# Patient Record
Sex: Female | Born: 2000 | Race: Black or African American | Hispanic: No | Marital: Single | Smoking: Never smoker
Health system: Southern US, Community
[De-identification: ages and names within clinical notes are randomized; demographics above are authoritative.]

## PROBLEM LIST (undated history)

## (undated) DIAGNOSIS — R443 Hallucinations, unspecified: Secondary | ICD-10-CM

## (undated) DIAGNOSIS — G4710 Hypersomnia, unspecified: Secondary | ICD-10-CM

## (undated) DIAGNOSIS — F840 Autistic disorder: Secondary | ICD-10-CM

## (undated) DIAGNOSIS — J45909 Unspecified asthma, uncomplicated: Secondary | ICD-10-CM

## (undated) DIAGNOSIS — R569 Unspecified convulsions: Secondary | ICD-10-CM

---
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
## 2021-03-02 IMAGING — CT CT HEAD W/O CM
4 series · 17 of 47 positions shown, 19 images · non-contrast
Comparison: Head CT scan 04/20/2018.

CLINICAL DATA: Increasing C scratch the history of seizures.
Seizures of increased over the past week. [id number] positive patient.

EXAM:
CT HEAD WITHOUT CONTRAST
TECHNIQUE: Contiguous axial images were obtained from the base of the skull
through the vertex without intravenous contrast.

[Series 2: head wo · axial · 0.40mm/px · z∈[-136,-16]mm · 7 of 33 slices shown, 9 images]
[im 5/33  brain]
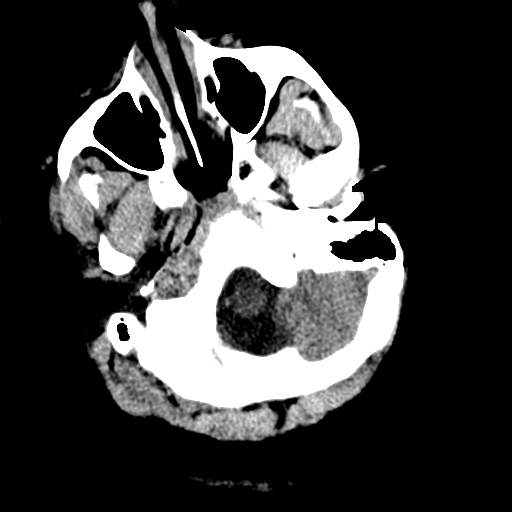
[im 5/33  bone]
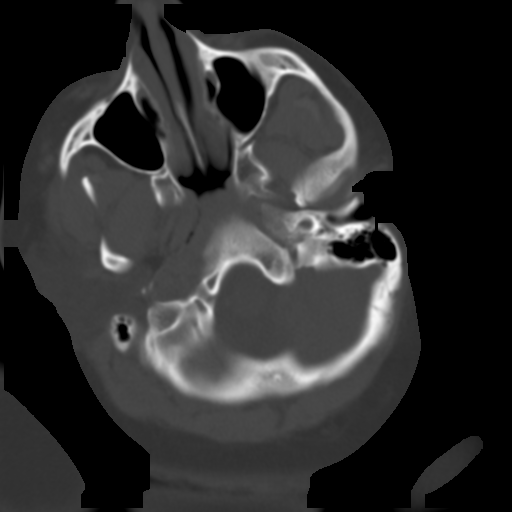
[im 9/33  brain]
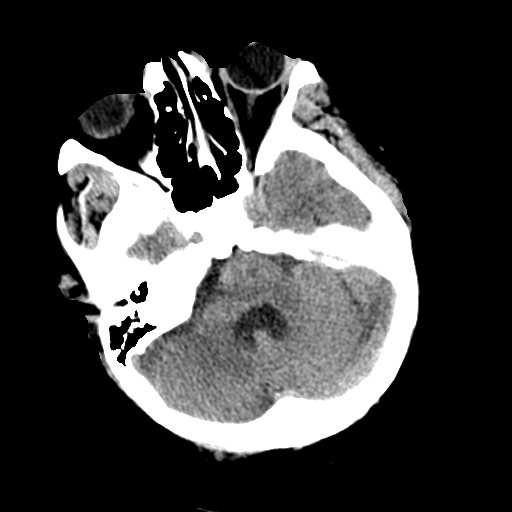
[im 13/33  brain]
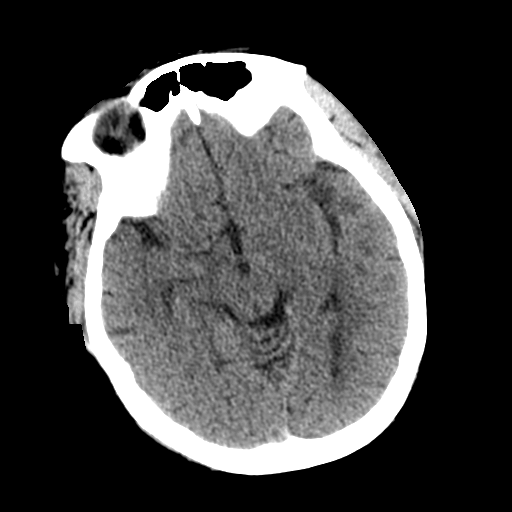
[im 17/33  brain]
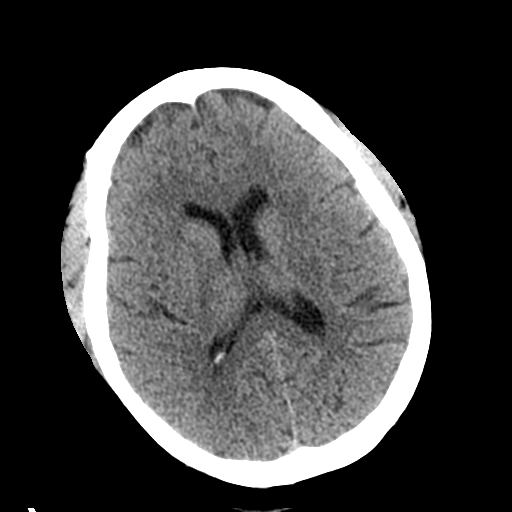
[im 21/33  brain]
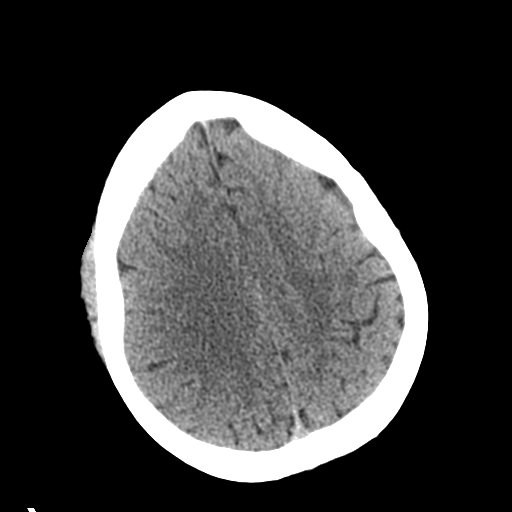
[im 21/33  bone]
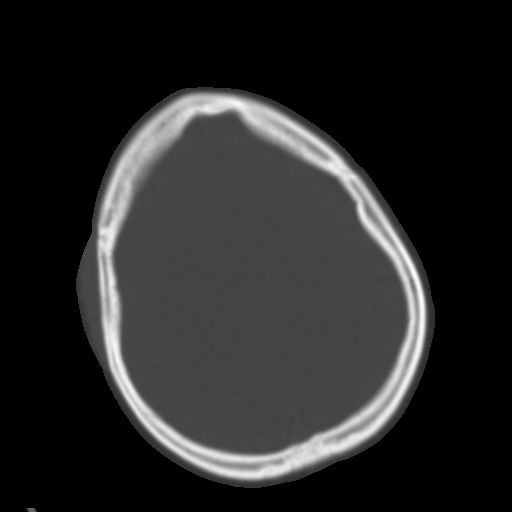
[im 25/33  brain]
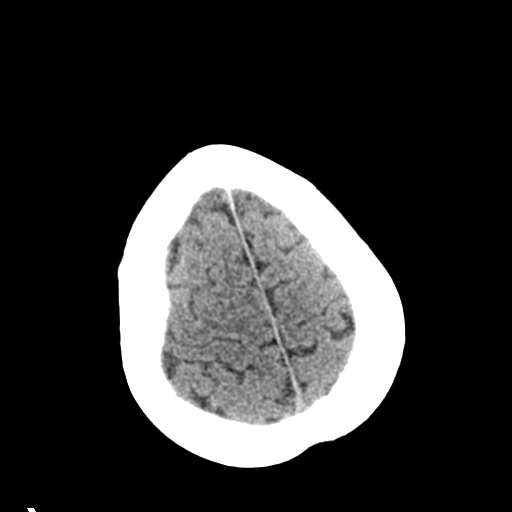
[im 29/33  brain]
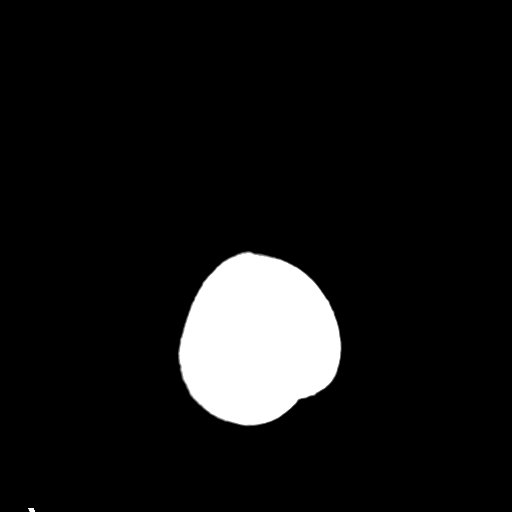

[Series 3: head bone · axial · 0.40mm/px · z∈[-140,-84]mm · 4 of 83 slices shown]
[im 9/83  bone]
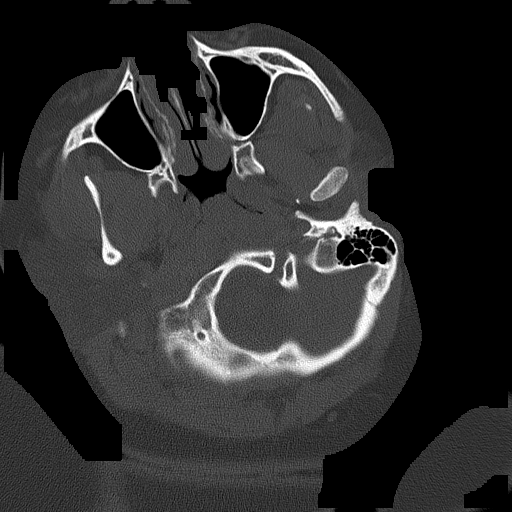
[im 17/83  bone]
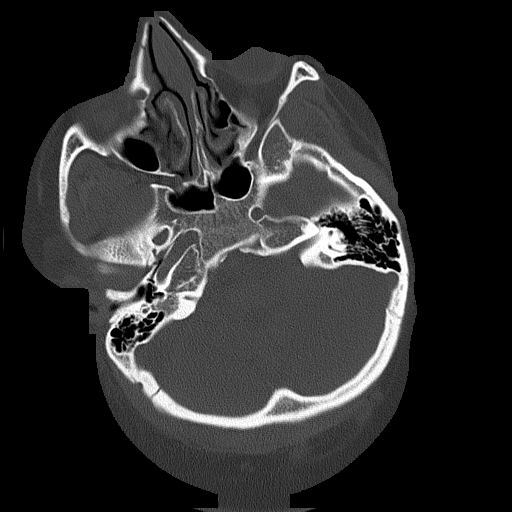
[im 25/83  bone]
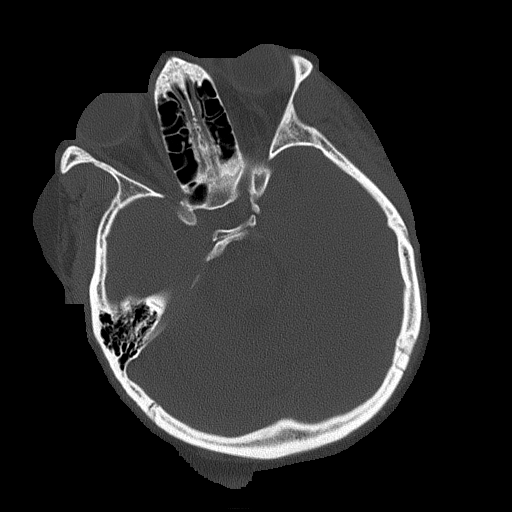
[im 37/83  bone]
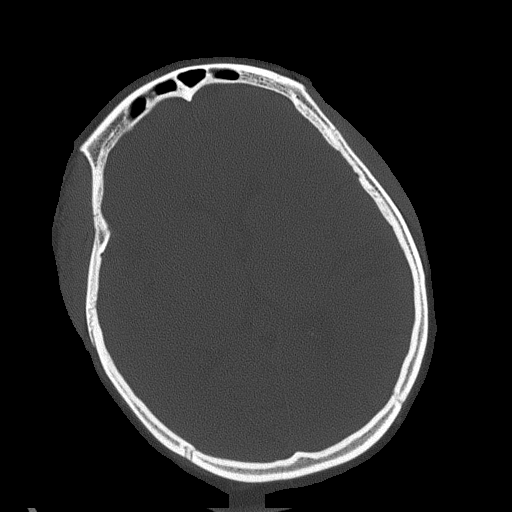

[Series 5: coronal soft tissue · coronal · 0.31mm/px · 3 of 59 slices shown]
[im 21/59  brain]
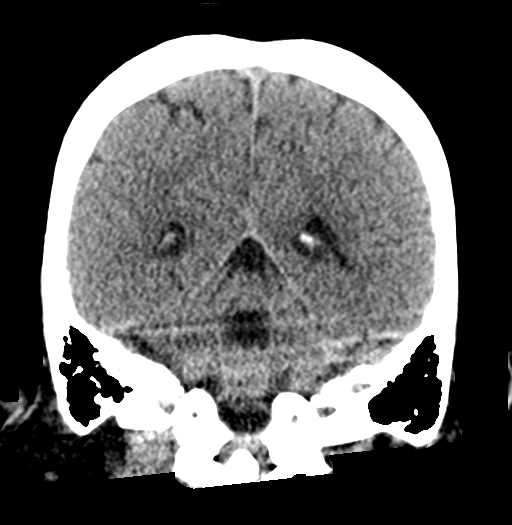
[im 27/59  brain]
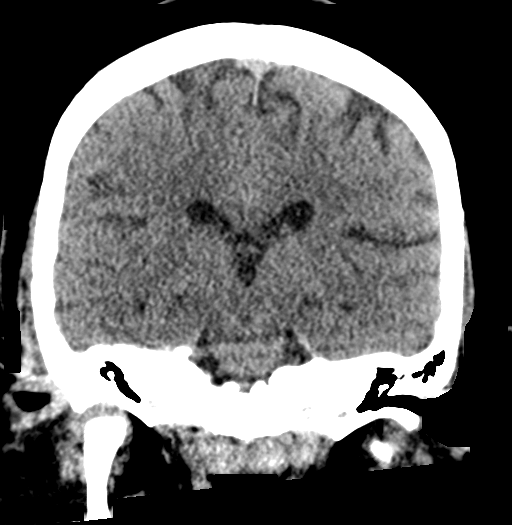
[im 32/59  brain]
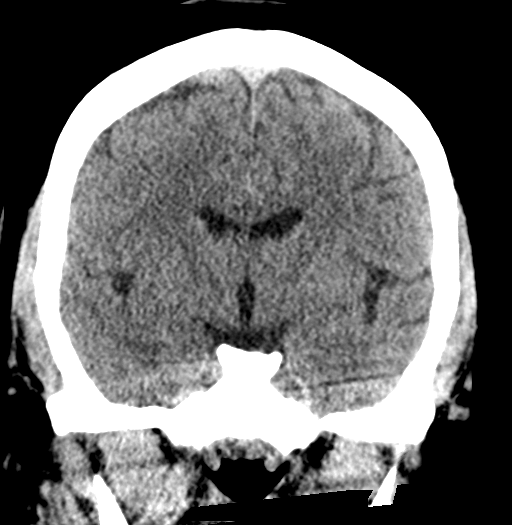

[Series 6: sagittal soft tissue · sagittal · 0.32mm/px · 3 of 54 slices shown]
[im 18/54  brain]
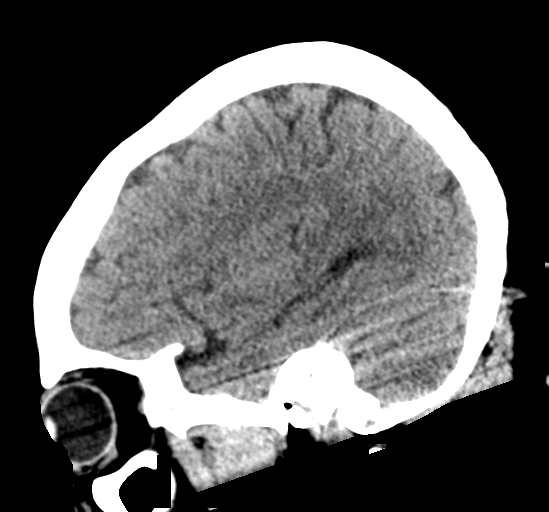
[im 27/54  brain]
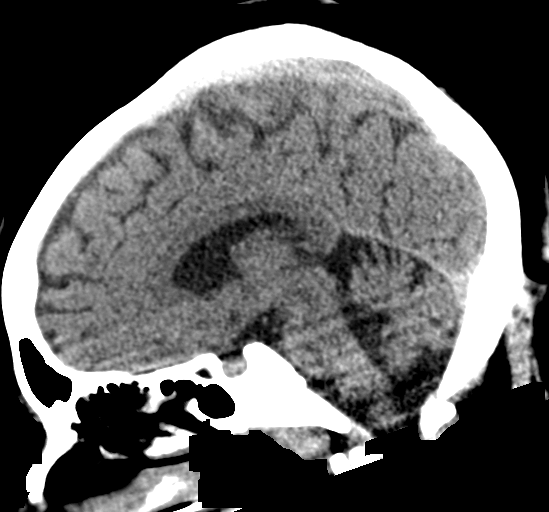
[im 36/54  brain]
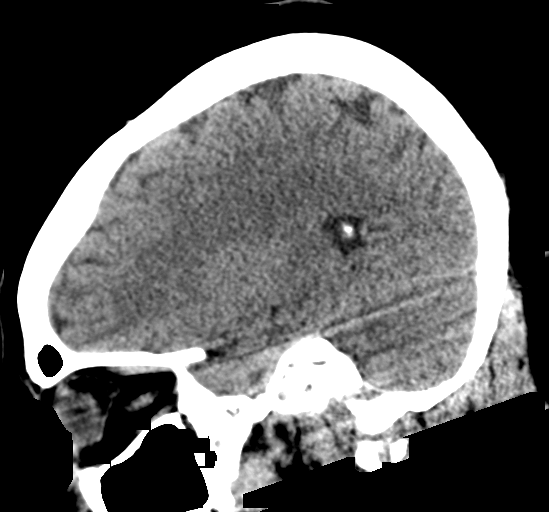

[17 of 47 positions shown; findings below may reference images not displayed]

FINDINGS: Brain: No evidence of acute infarction, hemorrhage, hydrocephalus,
extra-axial collection or mass lesion/mass effect.

Vascular: No hyperdense vessel or unexpected calcification.

Skull: Normal. Negative for fracture or focal lesion.

Sinuses/Orbits: Negative.

Other: None.
IMPRESSION: Negative head CT.

## 2021-03-02 IMAGING — DX DG CHEST 1V PORT
1 series · 1 of 1 positions shown · non-contrast
Comparison: Single-view of the chest 12/05/2018.

CLINICAL DATA: Increased frequency of seizures. Patient diagnosed
with [id number] approximately 1 week ago.

EXAM:
PORTABLE CHEST 1 VIEW

[chest ap]
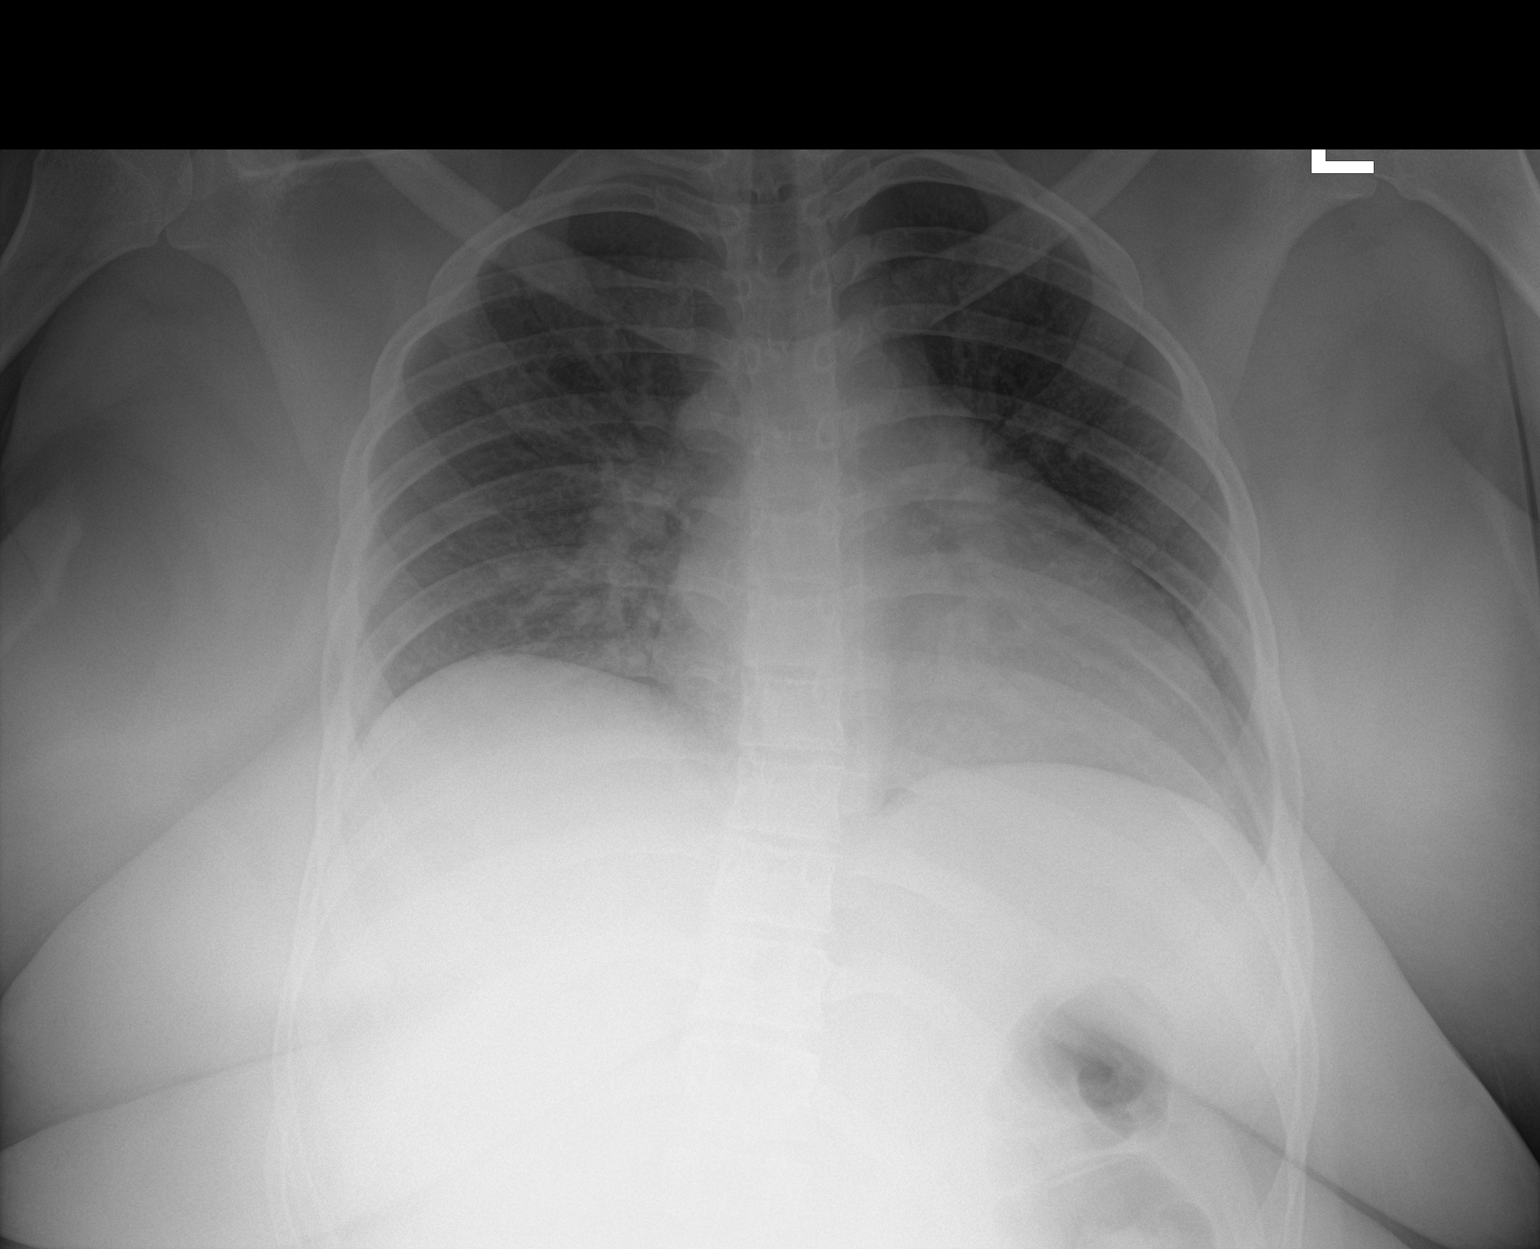

[1 of 1 positions shown; findings below may reference images not displayed]

FINDINGS: Lungs are clear. Heart size is normal. No pneumothorax or pleural
fluid. No acute or focal bony abnormality.
IMPRESSION: Negative chest.

## 2021-03-02 IMAGING — CR DG TIBIA/FIBULA 2V*L*
2 series · 2 of 2 positions shown · non-contrast
Comparison: None.

CLINICAL DATA: Fall, pain

EXAM:
LEFT TIBIA AND FIBULA - 2 VIEW

[x tib-fib lat left (1 of 2)]
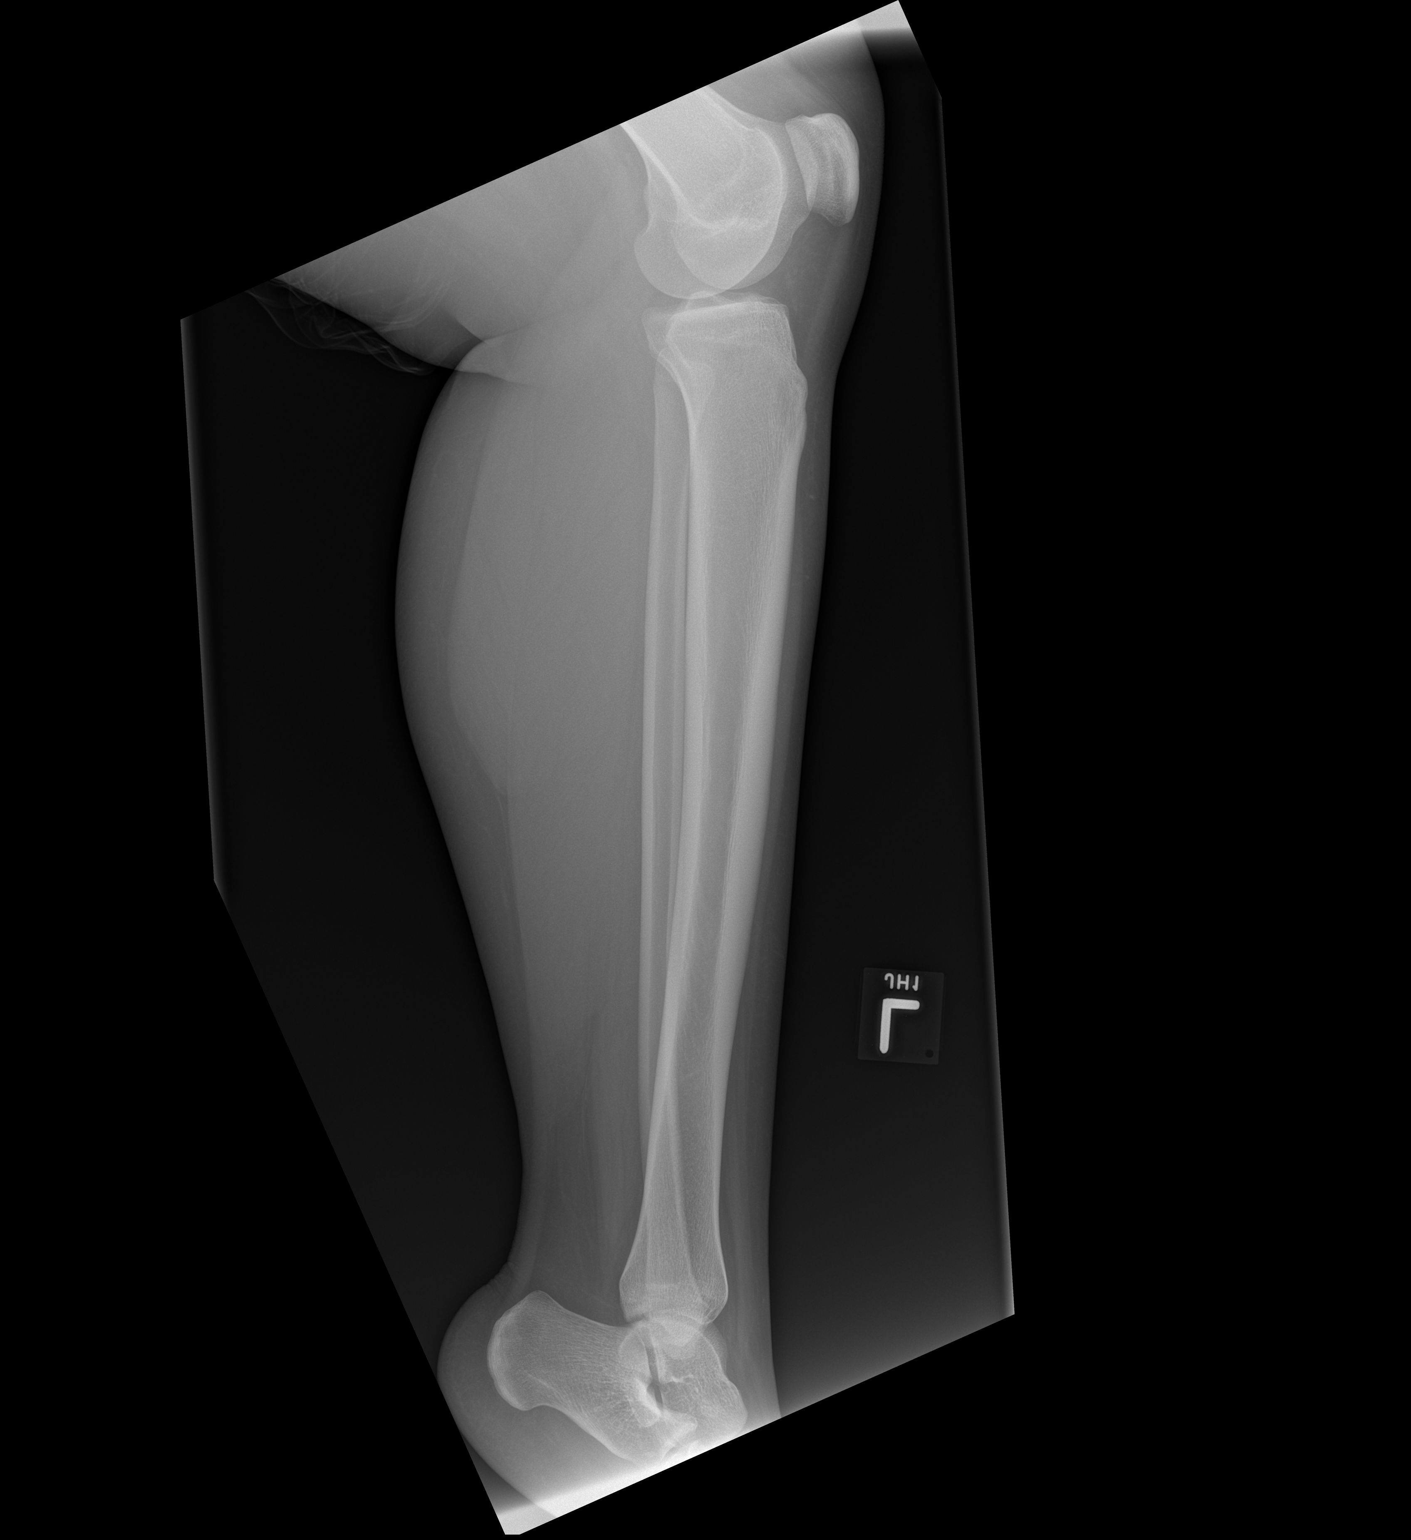

[x tib-fib lat left (2 of 2)]
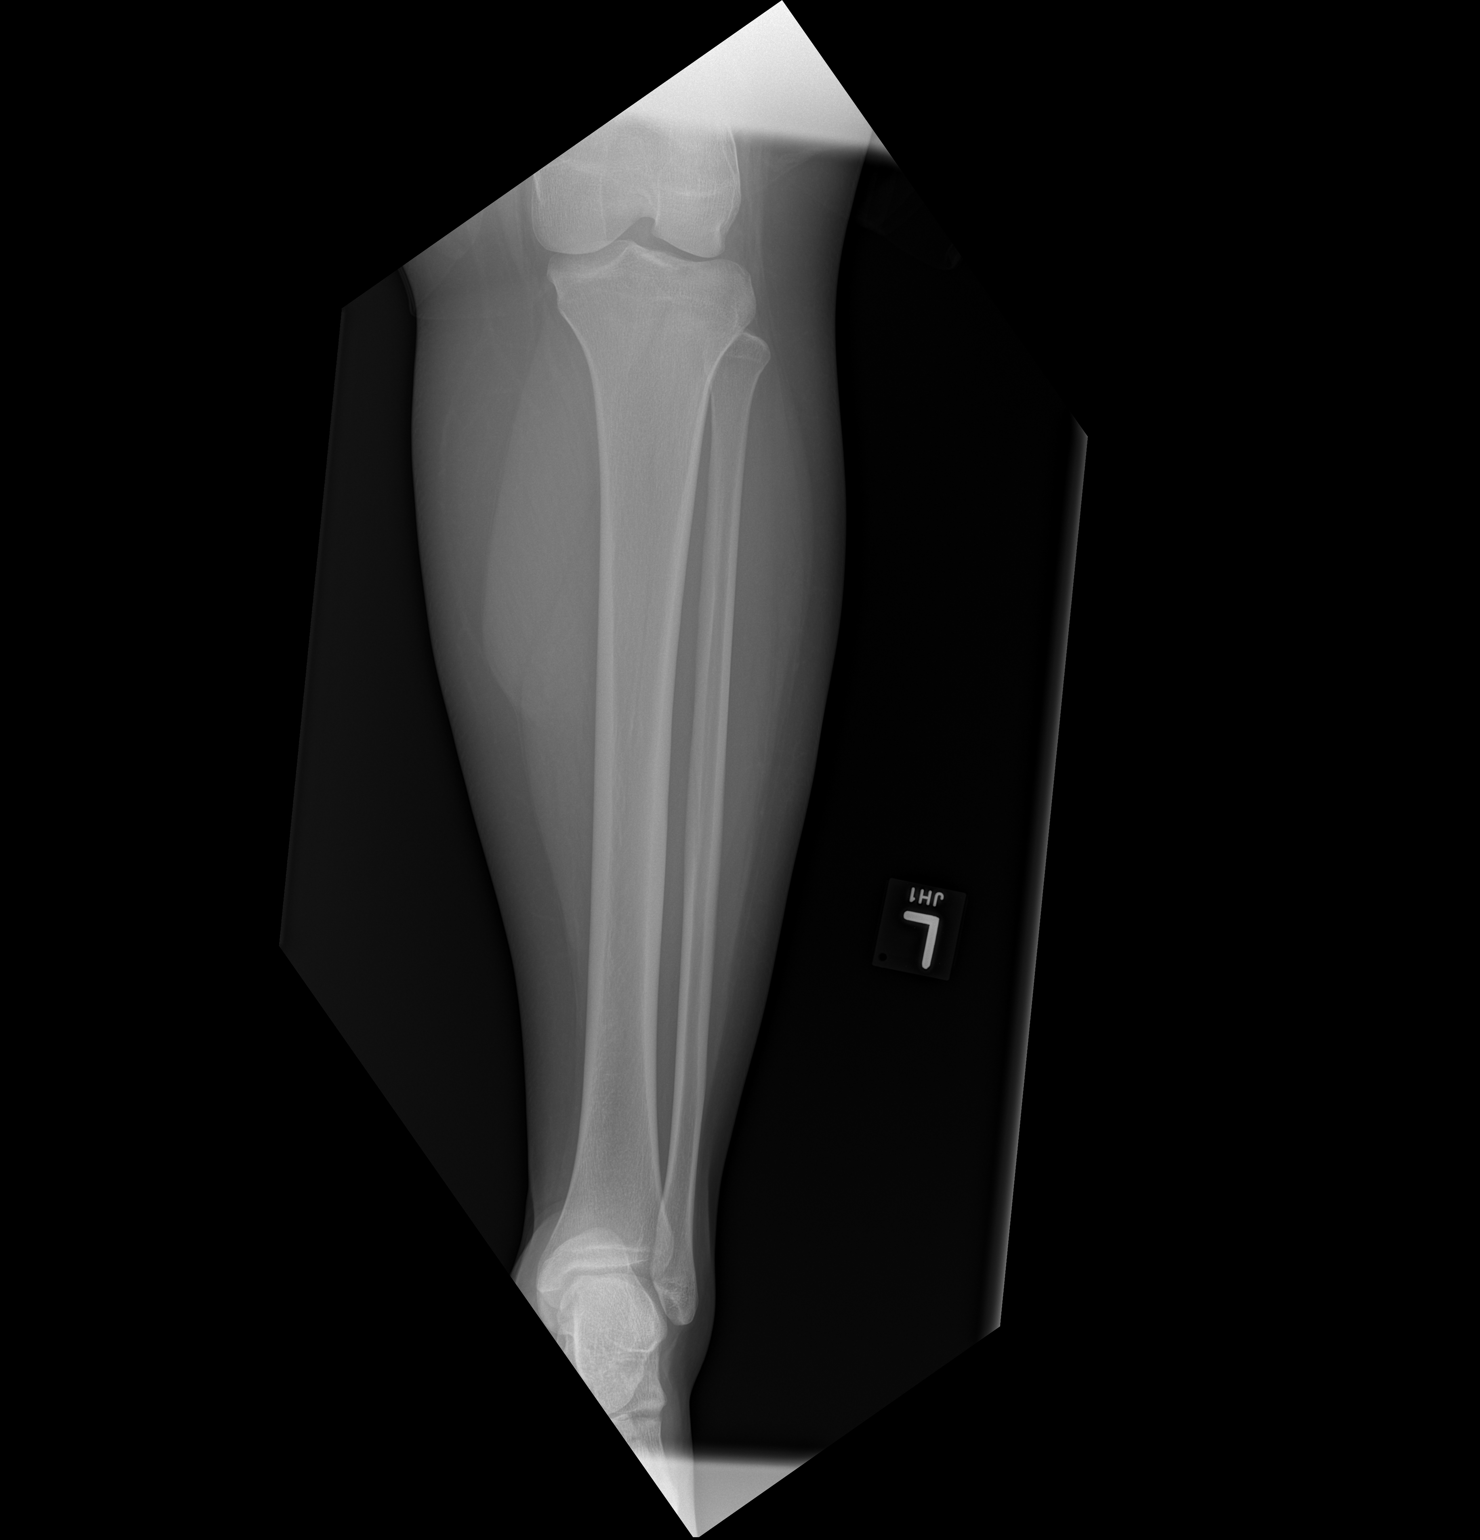

[2 of 2 positions shown; findings below may reference images not displayed]

FINDINGS: There is no evidence of fracture or other focal bone lesions. Soft
tissues are unremarkable.
IMPRESSION: Negative.
